# Patient Record
Sex: Female | Born: 1975 | Race: White | Hispanic: No | Marital: Single
Health system: Southern US, Community
[De-identification: ages and names within clinical notes are randomized; demographics above are authoritative.]

---

## 2007-09-30 ENCOUNTER — Ambulatory Visit: Payer: Self-pay

## 2009-03-23 ENCOUNTER — Ambulatory Visit: Payer: Self-pay | Admitting: Unknown Physician Specialty

## 2011-12-20 ENCOUNTER — Ambulatory Visit: Payer: Self-pay | Admitting: Obstetrics & Gynecology

## 2011-12-20 DIAGNOSIS — I10 Essential (primary) hypertension: Secondary | ICD-10-CM

## 2011-12-20 LAB — CBC
HGB: 13.4 g/dL (ref 12.0–16.0)
MCH: 29 pg (ref 26.0–34.0)
MCHC: 32.6 g/dL (ref 32.0–36.0)
WBC: 7.2 10*3/uL (ref 3.6–11.0)

## 2011-12-20 LAB — PREGNANCY, URINE: Pregnancy Test, Urine: NEGATIVE m[IU]/mL

## 2012-01-02 ENCOUNTER — Inpatient Hospital Stay: Payer: Self-pay | Admitting: Obstetrics & Gynecology

## 2012-01-03 LAB — HEMOGLOBIN: HGB: 12 g/dL (ref 12.0–16.0)

## 2012-01-05 LAB — PATHOLOGY REPORT

## 2012-01-12 ENCOUNTER — Emergency Department: Payer: Self-pay | Admitting: *Deleted

## 2012-01-13 ENCOUNTER — Ambulatory Visit: Payer: Self-pay | Admitting: Obstetrics and Gynecology

## 2012-01-13 LAB — COMPREHENSIVE METABOLIC PANEL
Albumin: 3.6 g/dL (ref 3.4–5.0)
Alkaline Phosphatase: 105 U/L (ref 50–136)
BUN: 9 mg/dL (ref 7–18)
Calcium, Total: 8.8 mg/dL (ref 8.5–10.1)
Chloride: 99 mmol/L (ref 98–107)
Co2: 31 mmol/L (ref 21–32)
Creatinine: 1.14 mg/dL (ref 0.60–1.30)
EGFR (Non-African Amer.): >60
Osmolality: 269 (ref 275–301)
Potassium: 3.7 mmol/L (ref 3.5–5.1)
SGPT (ALT): 54 U/L
Sodium: 135 mmol/L — ABNORMAL LOW (ref 136–145)
Total Protein: 8.8 g/dL — ABNORMAL HIGH (ref 6.4–8.2)

## 2012-01-13 LAB — URINALYSIS, COMPLETE
Bilirubin,UR: NEGATIVE
Glucose,UR: NEGATIVE mg/dL (ref 0–75)
Ketone: NEGATIVE
Leukocyte Esterase: NEGATIVE
Ph: 6 (ref 4.5–8.0)
RBC,UR: 1 /HPF (ref 0–5)
Squamous Epithelial: 3
WBC UR: <1 /HPF (ref 0–5)

## 2012-01-13 LAB — CBC WITH DIFFERENTIAL/PLATELET
Basophil #: 0 10*3/uL (ref 0.0–0.1)
Eosinophil %: 0.8 %
MCH: 28.6 pg (ref 26.0–34.0)
MCHC: 32 g/dL (ref 32.0–36.0)
MCV: 90 fL (ref 80–100)
Neutrophil #: 10.1 10*3/uL — ABNORMAL HIGH (ref 1.4–6.5)
Platelet: 401 10*3/uL (ref 150–440)
RDW: 12.4 % (ref 11.5–14.5)

## 2012-01-14 ENCOUNTER — Observation Stay: Payer: Self-pay | Admitting: Obstetrics and Gynecology

## 2012-01-18 ENCOUNTER — Ambulatory Visit: Payer: Self-pay | Admitting: Obstetrics & Gynecology

## 2012-01-18 LAB — CULTURE, BLOOD (SINGLE)

## 2013-04-14 ENCOUNTER — Ambulatory Visit: Payer: Self-pay | Admitting: Specialist

## 2013-05-01 ENCOUNTER — Ambulatory Visit: Payer: Self-pay | Admitting: Family Medicine

## 2014-02-24 ENCOUNTER — Ambulatory Visit: Payer: Self-pay | Admitting: Internal Medicine

## 2014-05-26 ENCOUNTER — Ambulatory Visit: Payer: Self-pay | Admitting: Nurse Practitioner

## 2014-10-20 NOTE — Op Note (Signed)
PATIENT NAME:  Leah Deleon, [name] MR#:  [id number] DATE OF BIRTH:  03-03-1976  DATE OF PROCEDURE:  01/18/2012  PREOPERATIVE DIAGNOSIS: Abdominal incision wound separation.   POSTOPERATIVE DIAGNOSIS: Abdominal incision wound separation.   PROCEDURES: Wound exploration, wound debridement, placement of wound vacuum.   SURGEON: Dierdre Searles. Paul Harris, MD  ANESTHESIA: General.   ESTIMATED BLOOD LOSS: None.   COMPLICATIONS: None.   FINDINGS: 10 x 8 x 4 cm wound separation to the superior part of the midline vertical skin incision. No evidence of pus or erythema. Debridement was performed and placement of wound VAC.   DISPOSITION: To recovery room stable.   TECHNIQUE: Patient is prepped and draped in the usual sterile fashion after adequate anesthesia is obtained in the supine position on the Operating Room table. Skin separation is examined. It had previously been packed and this packing had been removed prior to prep and drape. There was one small area of tunneling in the inferior direction and this is extended upward to the level of the skin so there is no tunneling at this time. Tissue that had a whitish glint to it was excised using a scalpel for completion of the debridement process. The wound was copiously irrigated with saline. Placement of a sponge cut 10 x 4 x 8 cm is performed followed by placement of the covering and hooking to the wound vacuum pump with excellent seal noted.      Patient tolerated the procedure well and goes to recovery room in stable condition with a wound VAC in place and will follow up with home health for continued monitoring of this device for wound healing purposes.   ____________________________ R. Annamarie MajorPaul Harris, MD rph:cms D: 01/18/2012 16:19:03 ET T: 01/18/2012 16:42:22 ET JOB#: [id number]  cc: Dierdre Searles. Paul Harris, MD, <Dictator> Nadara MustardOBERT P HARRIS MD ELECTRONICALLY SIGNED 01/18/2012 16:54

## 2014-10-25 NOTE — Consult Note (Signed)
PATIENT NAME:  Leah Deleon, Leah Deleon MR#:  [id number] DATE OF BIRTH:  15-Jul-1975  DATE OF CONSULTATION:  01/13/2012  REFERRING PHYSICIAN:   CONSULTING PHYSICIAN:  Leah Deleon  REASON FOR CONSULTATION: Postoperative wound infection.   HISTORY OF PRESENT ILLNESS: Leah Deleon is a 39 year old female who is status post a total abdominal hysterectomy on 01/02/2012 by Dr. Annamarie MajorPaul Deleon. The surgery was initially attempted laparoscopically and due to the inability of anesthesia to ventilate the patient it had to be converted to an open incision via vertical midline incision. The patient has been seen in clinic on three separate occasions this week due to complications with her incision with the second and third visits with the wound being opened and packed for a small portion of the wound. She called in the on-call line tonight and prior to her arrival to the Emergency Room reporting fevers up to 101 and increasing pain and redness around the incision site. On arrival to the Emergency Room she states that she has been having increasing leakage from the incision and she was soaking through her ABD pad that she has placed over it on several occasions. Again she reported fevers. No chills. No nausea. No severe abdominal pain, only pain upon palpation of her abdomen and with certain movements and positions. She is having for normal bowel and urinary habits. She has been taking Percocet for pain relief and has been trying to minimize taking those due because she does not want to mask a fever.   PAST MEDICAL HISTORY: 1. Generalized anxiety.  2. Asthma.  3. Hypertension. 4. Hypothyroidism.  5. Obesity.   PAST SURGICAL HISTORY:  1. Dilation and curettage.  2. Hysteroscopy.  3. Total abdominal hysterectomy.   CURRENT MEDICATIONS: 1. Keflex 500 mg tablets 1 tablet by mouth 4 times daily.  2. Percocet 5/325 mg tablets. 3. Alprazolam 1 mg tablets.  4. Levoxyl 150 mcg tablets.  5. Lisinopril 10 mg  tablets.  6. Wellbutrin SR 150 mg tablets.   ALLERGIES: No known drug allergies.   OB/GYN HISTORY: Patient is a gravida 0 who is now status post hysterectomy for menometrorrhagia and dysmenorrhea.   SOCIAL HISTORY: Patient does smoke cigarettes. She denies illegal drug use. She occasionally uses alcohol.   PHYSICAL EXAMINATION:  VITAL SIGNS: Temperature 37.8 degrees Celsius, 100 degrees Fahrenheit, pulse 106, respiratory rate 20, blood pressure 139/71, oxygen saturation 100% on room air, BMI 47.2.   GENERAL: No apparent distress.   CARDIOVASCULAR: Regular rate and rhythm.   PULMONARY: Clear to auscultation bilaterally.   ABDOMEN: Obese, soft, is tender to palpation, especially in the right lower quadrant and mildly so in the left lower quadrant. She has positive bowel sounds.   EXTREMITIES: No erythema, cords or tenderness. Trace edema.  INCISION: Incision is approximately 15 cm incision that has had staples removed. Steri-Strips are in place except for the superior most 5 to 6 cm, previously opened 2 to 3 cm area is noted with packing in place. The packing is removed and the bed in place appears nicely granulated.   LABORATORY, DIAGNOSTIC AND RADIOLOGICAL DATA: CT scan shows a collection of subcutaneous fluid that is approximately 5 to 6 cm in its greatest dimension beneath the superior aspect of the incision. There is no collection apparent below the level that has been opened already on the incision.   Comprehensive metabolic panel with glucose 98, BUN 9, creatinine 1.14, sodium 135, potassium 3.7, chloride 99, CO2 31.  CBC: WBC 13.7, hemoglobin 11.1,  hematocrit 34.9, platelets 401.   PROCEDURE NOTE: Given the findings of CT scan the wound was opened further in the cephalad direction for the remainder of the incision line. This was accomplished after cleaning the skin with Betadine and numbing the skin with 1% lidocaine with epinephrine. The scalpel was used to open the skin level and  a pair of scissors was used to open any connective tissue. However, prior to opening the deep tissue the tissue was probed using a long Q-tip which a pocket was found superior to the prior opening in the subcutaneous tissue which was explored and a large amount of what appeared to be mostly serous fluid was released. The prior opening was connected to the newer opening so a single pocket would be present. The area was copiously irrigated with sterile saline and packed with 1 inch iodoform gauze covered with two sterile 4 x 4's and an ABD pad taped above. The depth of the wound opening is approximately 8 to 9 cm. The fascial layer was probed and found to be intact. The patient tolerated this procedure very well.   ASSESSMENT AND RECOMMENDATIONS:  1. Postoperative wound infection. The patient is afebrile but certainly has an infection. Will change her antibiotic regimen around to Augmentin 2000/125 p.o. b.i.Deleon. and Bactrim DS p.o. b.i.Deleon. Will have the patient follow up tomorrow with Dr. Bonney Aid who is on call and will try to pack her wound again as an outpatient at the hospital tomorrow and arrange for home health either Sunday or Monday unless it is necessary to arrange it for Monday then will try to have her return to the hospital on Sunday for another wound packing. Ideally would have the wound packed twice a day. At some point either Monday or Sunday we will have a family member present who we can do teaching and her family member may be able to perform her wound packing and dressing changes for her instead of home health but that remains to be seen.  2. Follow up in the clinic as scheduled for Tuesday, July 16, with Dr. Tiburcio Pea but we will see her before then for wound dressing changes.   Thanks for this consultation. Will continue to follow along with this patient as an outpatient for now. The patient was given strict precautions regarding infection should her fever return, should her abdominal pain worsen  to return to the Emergency Room for inpatient management for infection.   ____________________________ Leah Novak, Deleon sdj:cms Deleon: 01/13/2012 04:07:28 ET T: 01/13/2012 09:18:56 ET  JOB#: [id number] cc: Leah Novak, Deleon, <Dictator> Leah Novak Deleon ELECTRONICALLY SIGNED 02/01/2012 19:57

## 2014-10-25 NOTE — Consult Note (Signed)
Brief Consult Note: Diagnosis: Post-operative wound infection.   Patient was seen by consultant.   Consult note dictated.   Discussed with Attending MD.   Comments: Patient stable for and comfortable with discharge. Change her PO antibiotics to  Augmentin 2,000/125 PO Daily (or 1,000mg /62.5mg  PO BID) Bactrim DS PO BID Will follow up tomorrow with Dr. Bonney AidStaebler for dressing change.  Electronic Signatures: Conard NovakJackson, Maleigha Colvard D (MD)  (Signed 13-Jul-13 04:18)  Authored: Brief Consult Note   Last Updated: 13-Jul-13 04:18 by Conard NovakJackson, Ladon Heney D (MD)

## 2014-10-25 NOTE — Op Note (Signed)
PATIENT NAME:  Leah HoehnLAMBERT, Leah Deleon MR#:  [id number] DATE OF BIRTH:  12-Mar-1976  DATE OF PROCEDURE:  01/02/2012  PREOPERATIVE DIAGNOSES:  1. Menorrhagia. 2. Endometrial hyperplasia.   POSTOPERATIVE DIAGNOSES: 1. Menorrhagia. 2. Endometrial hyperplasia.   PROCEDURE: Total abdominal hysterectomy.   SURGEON: Dierdre Searles. Paul Tylia Ewell, MD   ASSISTANT: Senaida LangeLashawn Weaver-Lee, MD   ANESTHESIA: General.   ESTIMATED BLOOD LOSS: 250 mL.   COMPLICATIONS: None.   FINDINGS: Due to the patient's significant obesity and body habitus, we were unable to adequately visualize with laparoscopy nor was anesthesia able to adequately ventilate and oxygenate the patient in Trendelenburg positioning with laparoscopy, gas, and pressures in place. Thus, a need to convert to open laparotomy was decided upon and performed. The patient had normal ovaries during her examination of the intraabdominal cavity.   DISPOSITION: To recovery room in stable condition.   TECHNIQUE: The patient is prepped and draped in the usual sterile fashion after adequate anesthesia is obtained in the dorsal lithotomy position. Foley catheter is inserted and a sponge stick is placed vaginally for manipulation purposes.   Attention is then turned to the abdomen where a Veress needle is inserted through a 5 mm infraumbilical incision after Marcaine is used to anesthetize the skin. Veress needle placement is confirmed using the hanging drop technique and the abdomen is then insufflated with CO2 gas. A 5 mm trocar is inserted under visualization with the laparoscope with no injuries or bleeding noted. A left upper quadrant 5 mm incision and trocar is placed for better attempts at visualization of the anterior abdominal and pelvic cavity. A right lower quadrant 11 mm incision and trocar is placed to attempt to retract omentum to adequately visualize the pelvis. Despite all these measures and with the difficulty with the patient placed in Trendelenburg due to  respiratory and ventilation difficulties, decision is made to open for laparotomy to complete the surgery.   Trocars are removed. A scalpel is used to create a midline vertical skin incision which is carried down to the level of the rectus fascia. Rectus fascia is dissected superiorly and inferiorly using Mayo scissors and the muscles are separated in the midline. Peritoneum is penetrated. Retractors were placed for adequate visualization of the intraabdominal and pelvic anatomy. The uterus was grasped with a double-tooth tenaculum. The uteroovarian blood vessels and ligaments are clamped, transected, and suture ligated twice with Vicryl sutures. Dissection is carried down to the level of the uterine arteries which are carefully clamped, transected, and suture ligated. Ovaries are preserved with good blood flow. Once the level of the external os of the cervix was reached, the uterus and cervix is amputated. Sutures are placed across the vaginal cuff in an interrupted fashion. Excellent hemostasis is noted. The pelvic cavity is irrigated. Examination reveals no dissection or injury to ureter or bowel organs. Retractors and any lap sponges are then removed at this time. Counts were all correct. The peritoneum is closed with a Vicryl suture. The rectus fascia is closed with a PDS suture. Subcutaneous tissues are irrigated and hemostasis is assured using electrocautery. A subcutaneous closure is performed with a plain gut suture and then skin is closed with surgical clips. The laparoscopic incisions are closed with Dermabond. Sponge stick is removed. Foley catheter is left in place. Bandages are applied. The patient goes to the recovery room in stable condition. All sponge, instrument, and needle counts are correct.    ____________________________ R. Annamarie MajorPaul Boluwatife Flight, MD rph:drc Deleon: 01/02/2012 13:05:29 ET T: 01/02/2012 13:56:56 ET  JOB#: [id number]  cc: Dierdre Searles, MD, <Dictator> Nadara Mustard MD ELECTRONICALLY  SIGNED 01/02/2012 16:26

## 2016-02-23 IMAGING — CR DG LUMBAR SPINE COMPLETE 4+V
1 series · 5 of 5 positions shown · non-contrast
Comparison: None.

CLINICAL DATA: Injured back 1 month ago lifting a box, pain in low
back into RIGHT hip and leg, pulls and catches and legs want to give
out, RIGHT leg sciatica

EXAM:
LUMBAR SPINE - COMPLETE 4+ VIEW

[Series 1: ap · 0.17mm/px · 5 of 5 slices shown]
[im 1/5]
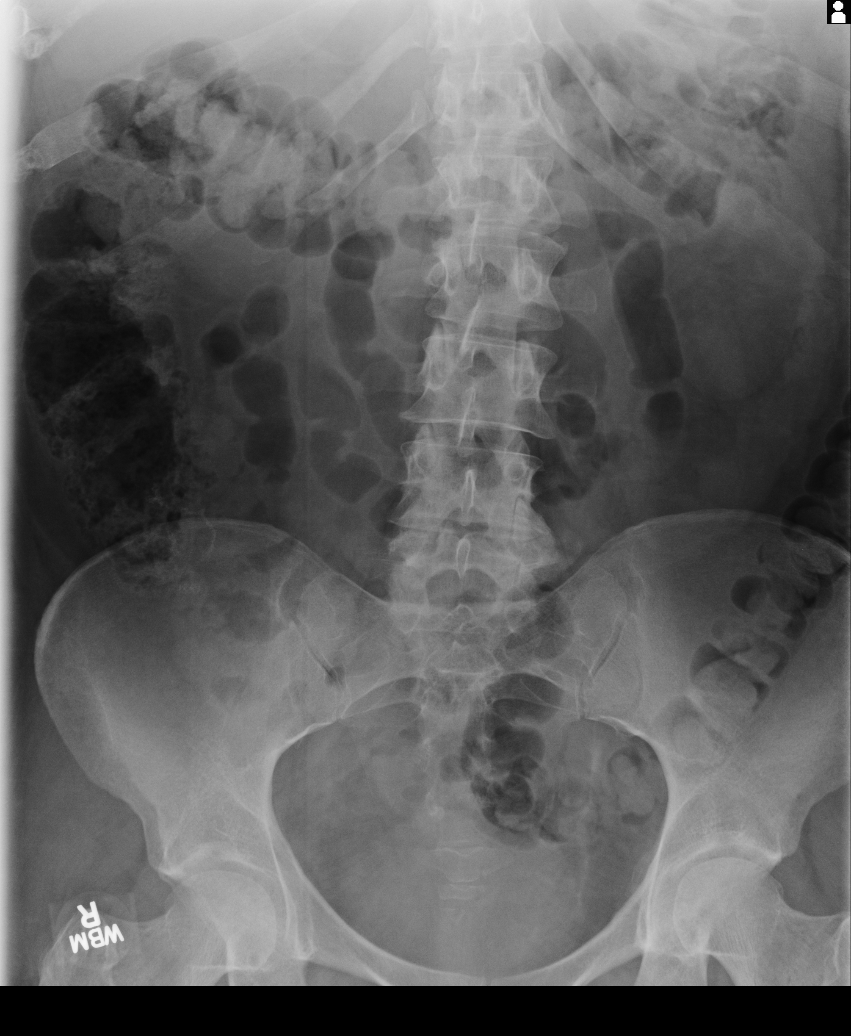
[im 2/5]
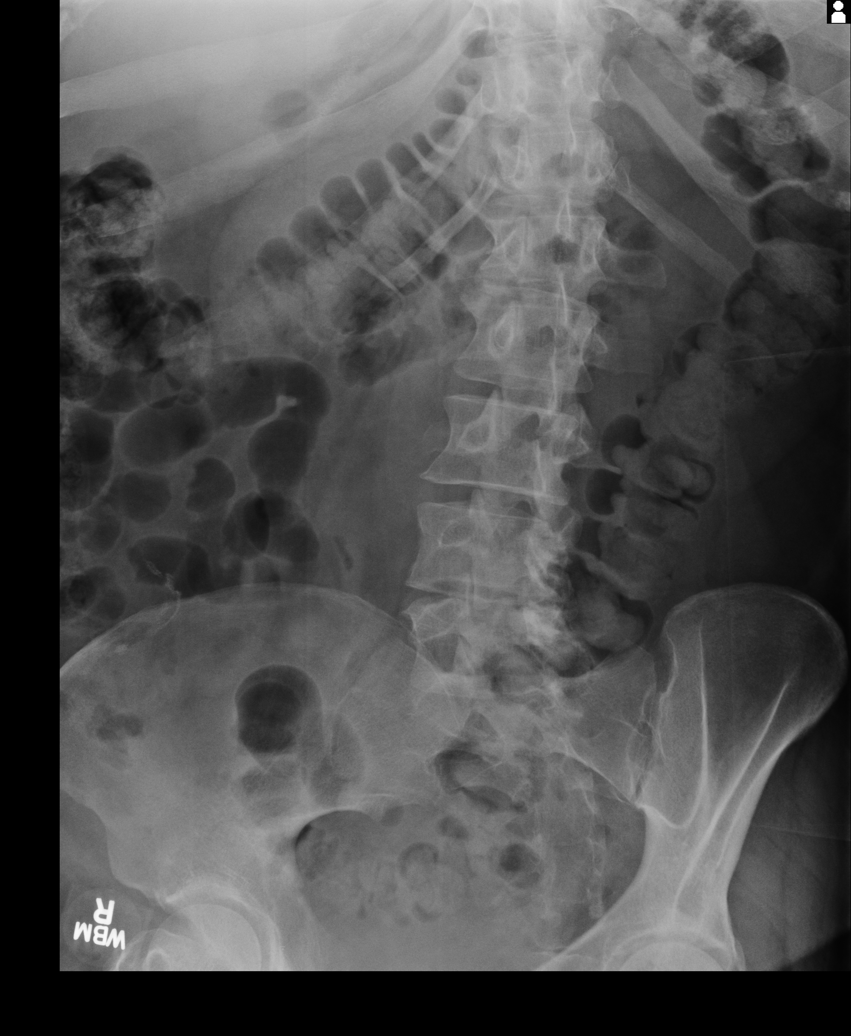
[im 3/5]
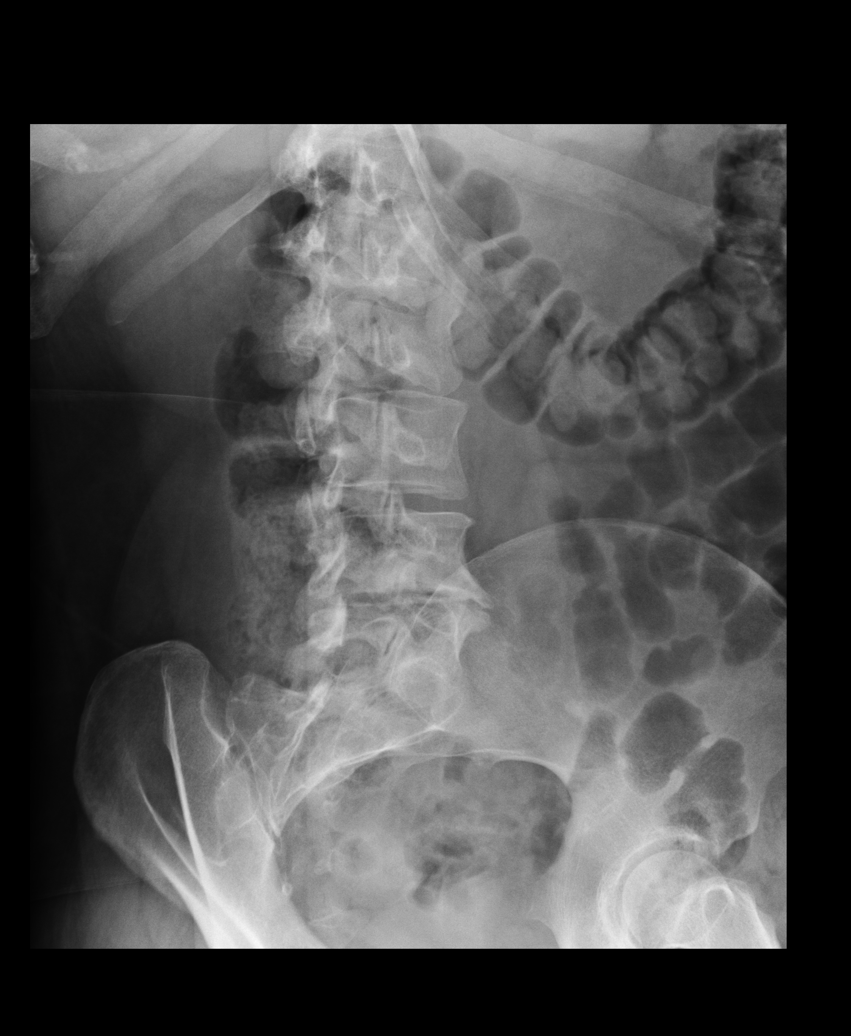
[im 4/5]
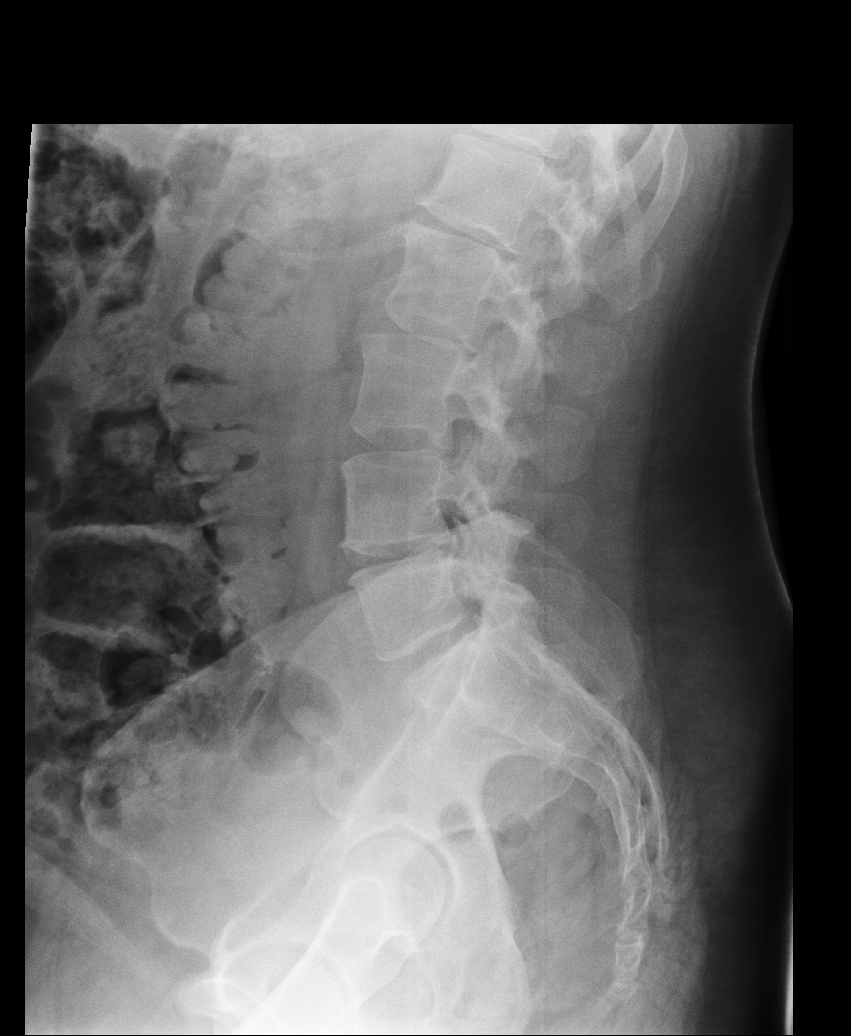
[im 5/5]
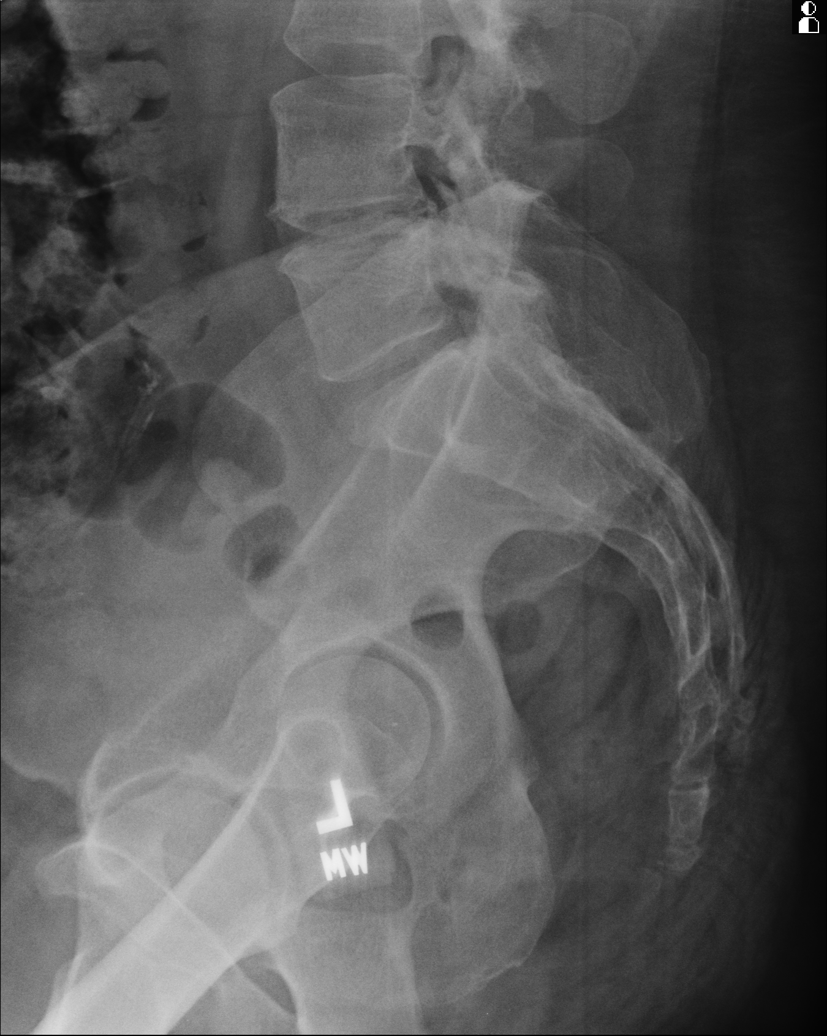

[5 of 5 positions shown; findings below may reference images not displayed]

FINDINGS: Five non-rib-bearing lumbar vertebrae.

Osseous mineralization grossly normal.

Symmetric hip and SI joints.

Disc space narrowing with endplate spur formation at L4-L5 and at
L1-L2.

Vertebral body heights maintained without fracture or subluxation.

No spondylolysis or bone destruction.

Visualized bowel gas pattern normal.
IMPRESSION: Degenerative disc disease changes L4-L5 and L1-L2.

No acute abnormalities.
